# Patient Record
Sex: Female | Born: 1970 | Race: White | Hispanic: No | Marital: Married
Health system: Southern US, Community
[De-identification: ages and names within clinical notes are randomized; demographics above are authoritative.]

## PROBLEM LIST (undated history)

## (undated) DIAGNOSIS — E785 Hyperlipidemia, unspecified: Secondary | ICD-10-CM

## (undated) DIAGNOSIS — I2510 Atherosclerotic heart disease of native coronary artery without angina pectoris: Secondary | ICD-10-CM

## (undated) HISTORY — DX: Hyperlipidemia, unspecified: E78.5

## (undated) HISTORY — DX: Atherosclerotic heart disease of native coronary artery without angina pectoris: I25.10

---

## 1997-06-15 ENCOUNTER — Other Ambulatory Visit: Admission: RE | Admit: 1997-06-15 | Discharge: 1997-06-15 | Payer: Self-pay | Admitting: Obstetrics and Gynecology

## 1998-06-20 ENCOUNTER — Other Ambulatory Visit: Admission: RE | Admit: 1998-06-20 | Discharge: 1998-06-20 | Payer: Self-pay | Admitting: Obstetrics and Gynecology

## 1999-07-16 ENCOUNTER — Other Ambulatory Visit: Admission: RE | Admit: 1999-07-16 | Discharge: 1999-07-16 | Payer: Self-pay | Admitting: Obstetrics and Gynecology

## 2000-08-26 ENCOUNTER — Other Ambulatory Visit: Admission: RE | Admit: 2000-08-26 | Discharge: 2000-08-26 | Payer: Self-pay | Admitting: Obstetrics and Gynecology

## 2001-08-27 ENCOUNTER — Other Ambulatory Visit: Admission: RE | Admit: 2001-08-27 | Discharge: 2001-08-27 | Payer: Self-pay | Admitting: Obstetrics and Gynecology

## 2002-03-09 ENCOUNTER — Inpatient Hospital Stay (HOSPITAL_COMMUNITY): Admission: AD | Admit: 2002-03-09 | Discharge: 2002-03-12 | Payer: Self-pay | Admitting: Obstetrics and Gynecology

## 2002-04-13 ENCOUNTER — Other Ambulatory Visit: Admission: RE | Admit: 2002-04-13 | Discharge: 2002-04-13 | Payer: Self-pay | Admitting: Obstetrics and Gynecology

## 2003-10-11 ENCOUNTER — Other Ambulatory Visit: Admission: RE | Admit: 2003-10-11 | Discharge: 2003-10-11 | Payer: Self-pay | Admitting: Obstetrics and Gynecology

## 2004-07-26 ENCOUNTER — Inpatient Hospital Stay (HOSPITAL_COMMUNITY): Admission: AD | Admit: 2004-07-26 | Discharge: 2004-07-29 | Payer: Self-pay | Admitting: Obstetrics and Gynecology

## 2004-09-06 ENCOUNTER — Other Ambulatory Visit: Admission: RE | Admit: 2004-09-06 | Discharge: 2004-09-06 | Payer: Self-pay | Admitting: Obstetrics and Gynecology

## 2008-11-01 ENCOUNTER — Inpatient Hospital Stay (HOSPITAL_COMMUNITY): Admission: RE | Admit: 2008-11-01 | Discharge: 2008-11-02 | Payer: Self-pay | Admitting: Obstetrics and Gynecology

## 2010-04-12 LAB — CBC
HCT: 37.7 % (ref 36.0–46.0)
HCT: 38.9 % (ref 36.0–46.0)
Hemoglobin: 12.8 g/dL (ref 12.0–15.0)
MCHC: 33.8 g/dL (ref 30.0–36.0)
MCV: 96.4 fL (ref 78.0–100.0)
RBC: 4.03 MIL/uL (ref 3.87–5.11)
RDW: 13.5 % (ref 11.5–15.5)
WBC: 10.5 10*3/uL (ref 4.0–10.5)

## 2012-12-24 ENCOUNTER — Other Ambulatory Visit: Payer: Self-pay | Admitting: Obstetrics and Gynecology

## 2012-12-24 DIAGNOSIS — R928 Other abnormal and inconclusive findings on diagnostic imaging of breast: Secondary | ICD-10-CM

## 2013-01-06 ENCOUNTER — Other Ambulatory Visit: Payer: Self-pay

## 2013-01-11 ENCOUNTER — Ambulatory Visit
Admission: RE | Admit: 2013-01-11 | Discharge: 2013-01-11 | Disposition: A | Discharge status: Home / Self Care | Payer: Commercial Managed Care - PPO | Source: Ambulatory Visit | Attending: Obstetrics and Gynecology | Admitting: Obstetrics and Gynecology

## 2013-01-11 DIAGNOSIS — R928 Other abnormal and inconclusive findings on diagnostic imaging of breast: Secondary | ICD-10-CM

## 2013-01-29 ENCOUNTER — Other Ambulatory Visit: Payer: Self-pay | Admitting: Obstetrics and Gynecology

## 2013-01-29 DIAGNOSIS — R922 Inconclusive mammogram: Secondary | ICD-10-CM

## 2013-01-29 DIAGNOSIS — Z803 Family history of malignant neoplasm of breast: Secondary | ICD-10-CM

## 2013-02-05 ENCOUNTER — Telehealth: Payer: Self-pay | Admitting: Genetic Counselor

## 2013-02-05 NOTE — Telephone Encounter (Signed)
LEFT MESSAGE FOR PT TO RETURN CALL TO SCHEDULE GENETIC APPT.

## 2013-06-22 ENCOUNTER — Other Ambulatory Visit: Payer: Self-pay | Admitting: Obstetrics and Gynecology

## 2013-06-22 DIAGNOSIS — N6489 Other specified disorders of breast: Secondary | ICD-10-CM

## 2013-07-15 ENCOUNTER — Encounter (INDEPENDENT_AMBULATORY_CARE_PROVIDER_SITE_OTHER): Payer: Self-pay

## 2013-07-15 ENCOUNTER — Ambulatory Visit
Admission: RE | Admit: 2013-07-15 | Discharge: 2013-07-15 | Disposition: A | Discharge status: Home / Self Care | Payer: Commercial Managed Care - PPO | Source: Ambulatory Visit | Attending: Obstetrics and Gynecology | Admitting: Obstetrics and Gynecology

## 2013-07-15 DIAGNOSIS — N6489 Other specified disorders of breast: Secondary | ICD-10-CM

## 2014-11-14 IMAGING — US US BREAST COMPLETE UNI LEFT INC AXILLA
1 series · 7 of 7 positions shown · non-contrast
Comparison: Prior examinations

CLINICAL DATA: Patient recalled from screening for left breast
asymmetry.

EXAM:
DIGITAL DIAGNOSTIC  LEFT MAMMOGRAM WITH CAD
ULTRASOUND LEFT BREAST

[Series 1: us breast complete uni left inc axilla · 7 of 7 slices shown]
[im 1/7]
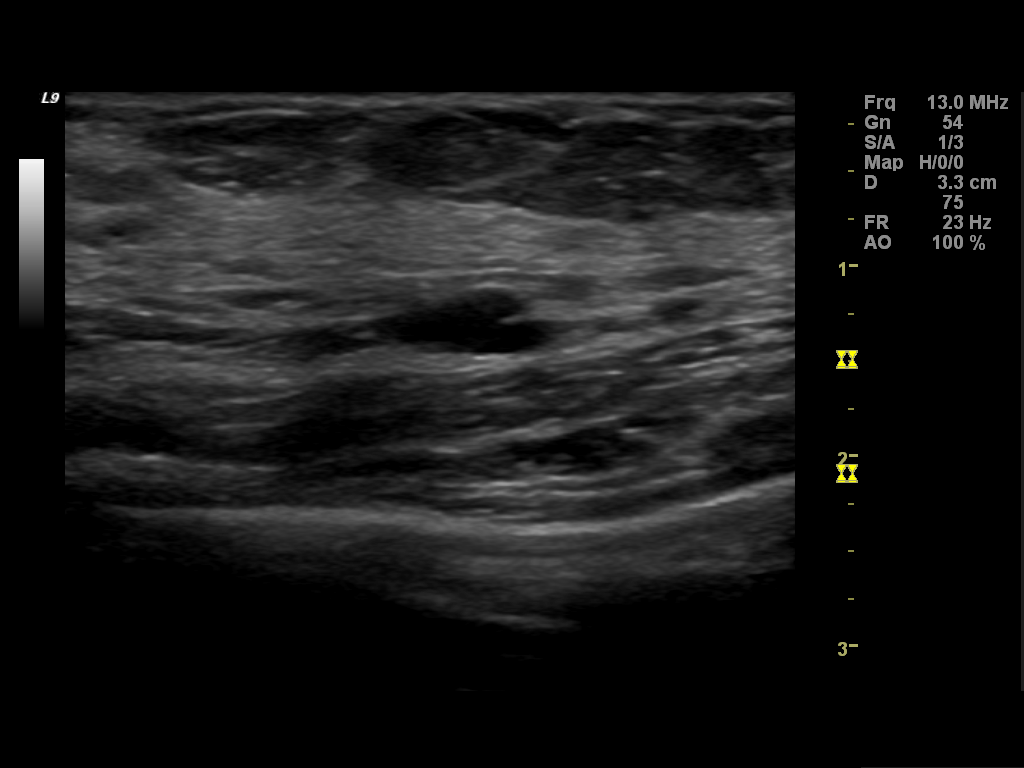
[im 2/7]
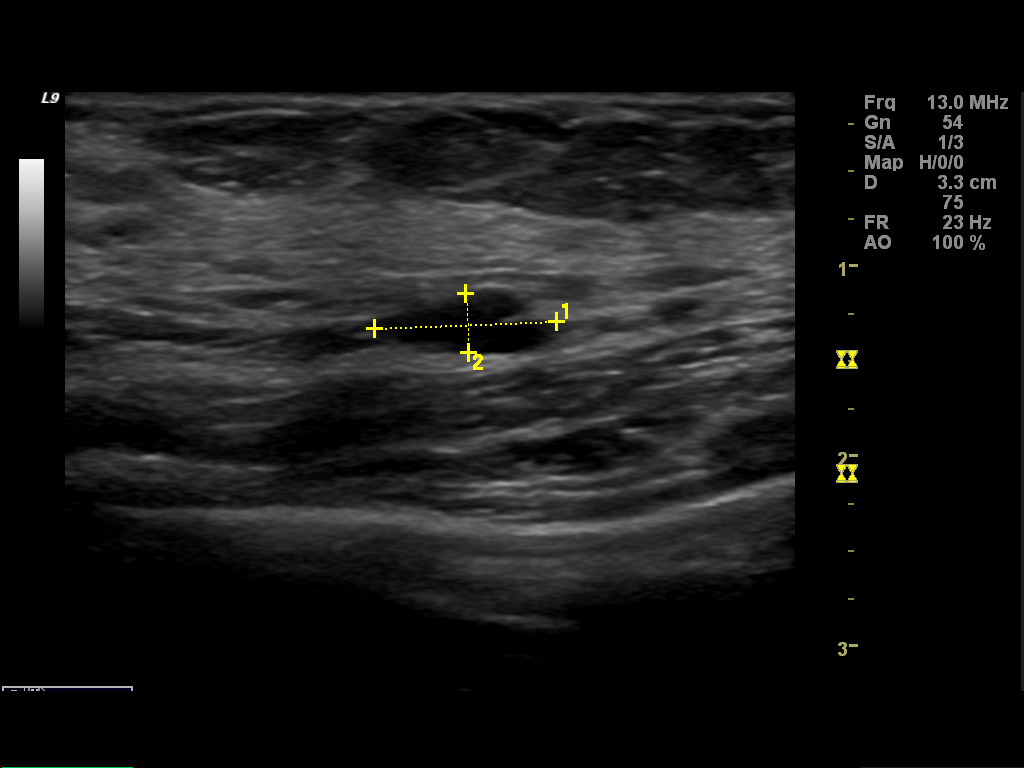
[im 3/7]
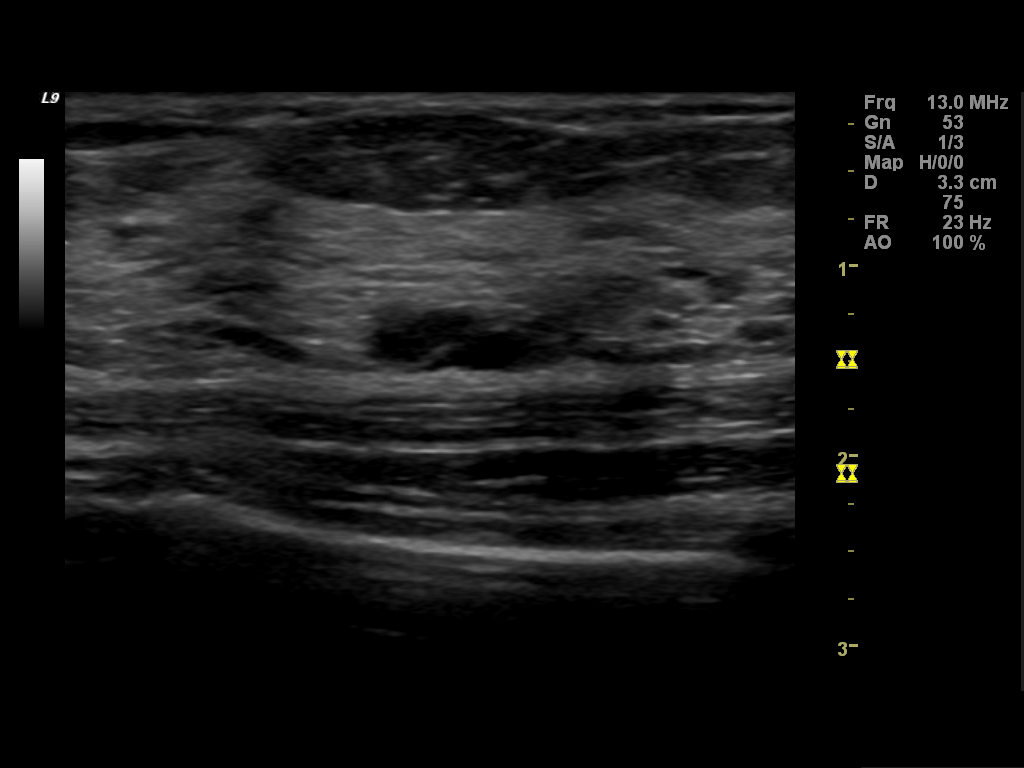
[im 4/7]
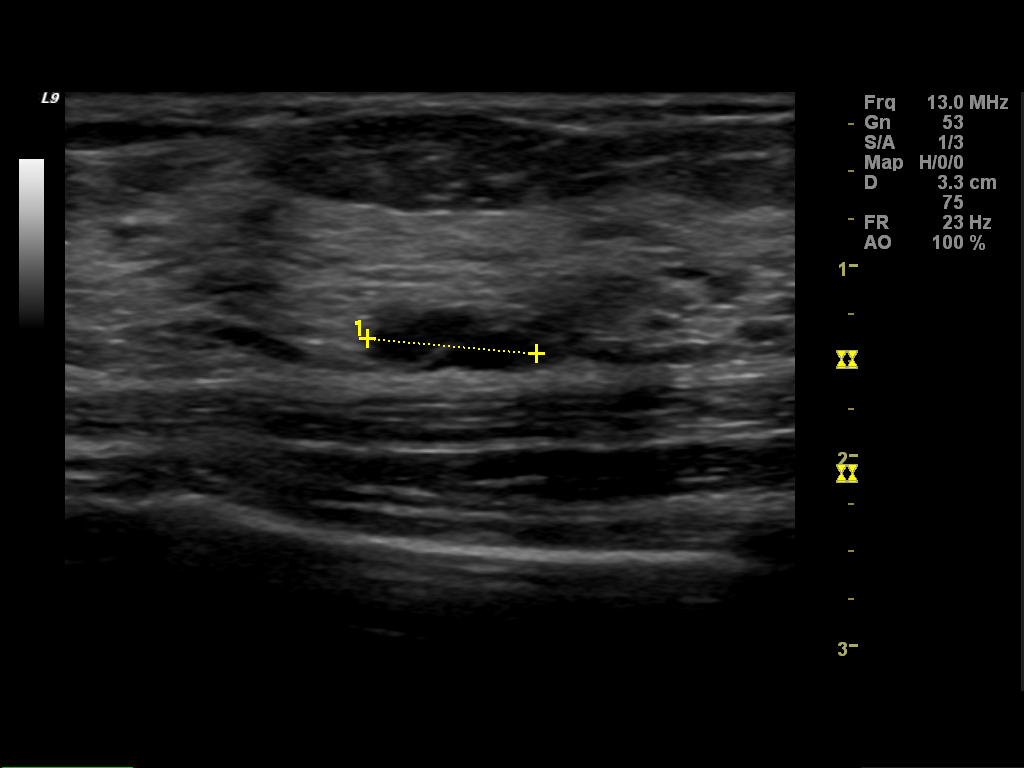
[im 5/7]
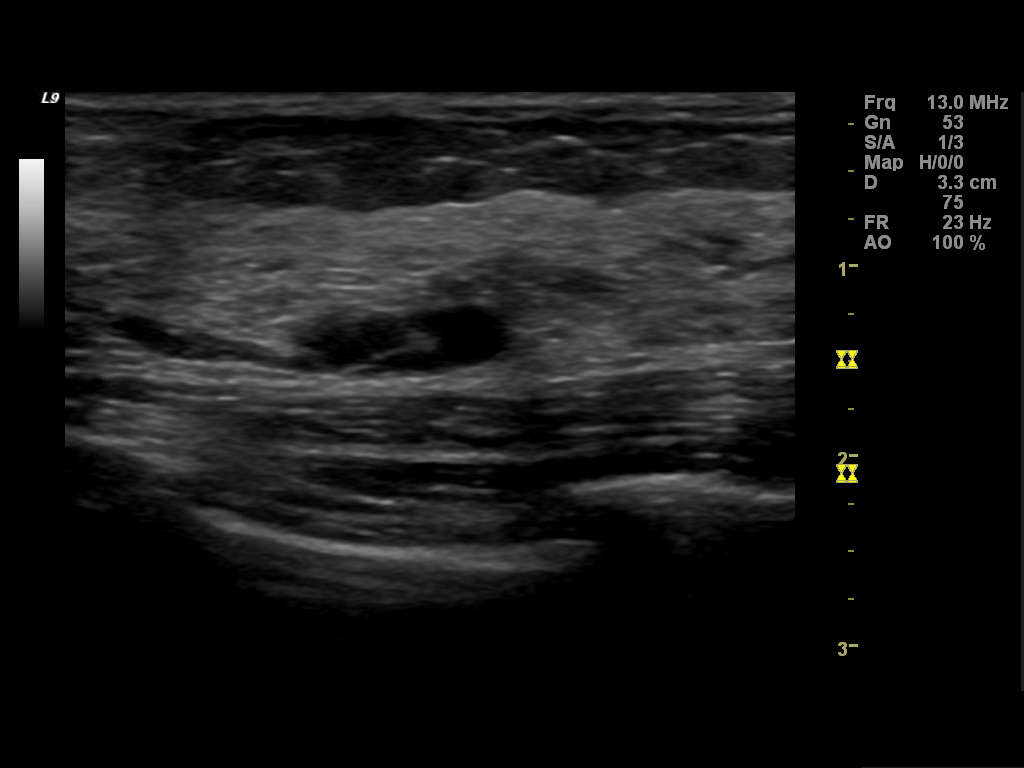
[im 6/7]
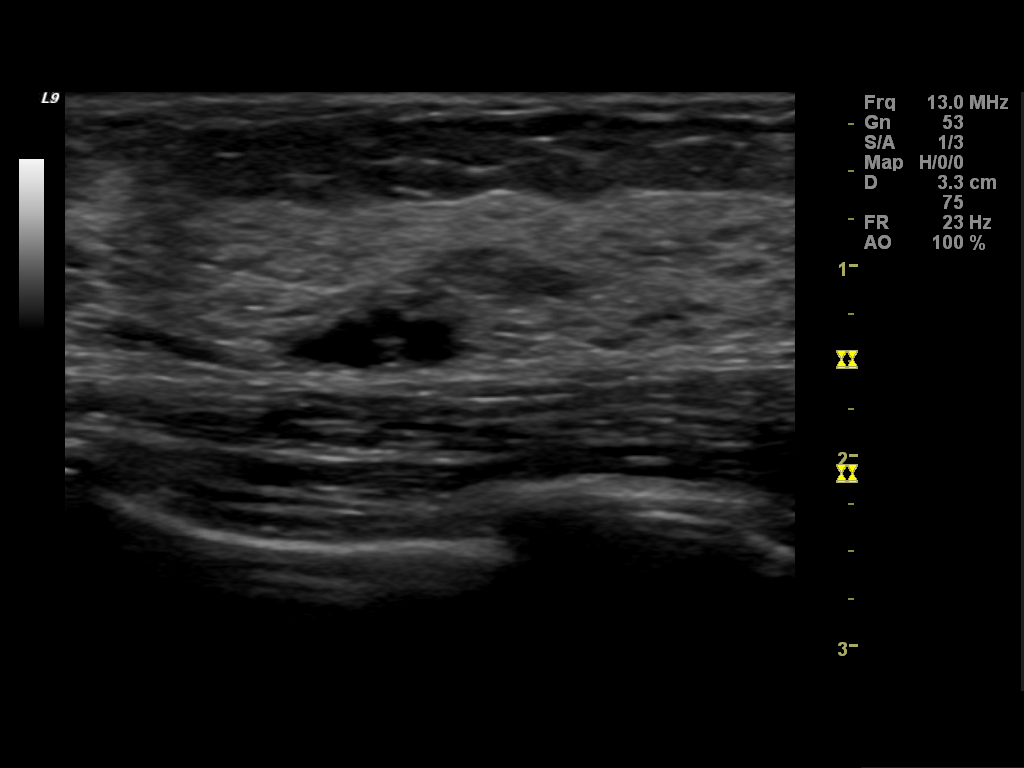
[im 7/7]
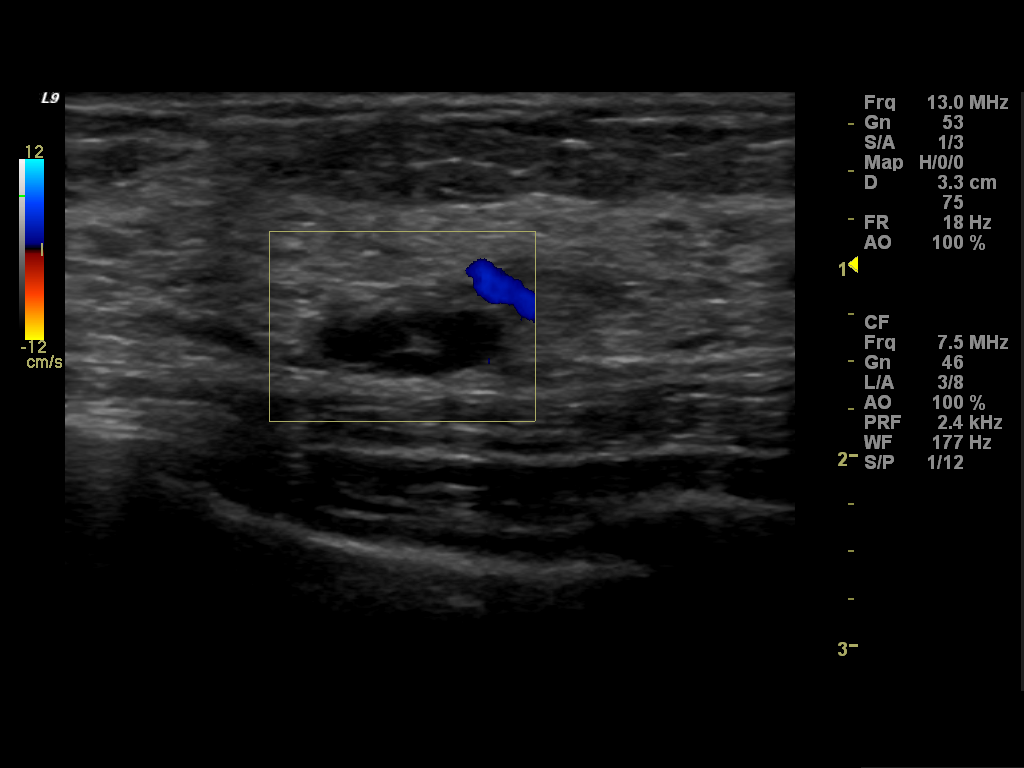

[7 of 7 positions shown; findings below may reference images not displayed]

ACR Breast Density Category c: The breasts are heterogeneously
dense, which may obscure small masses.
FINDINGS: Spot compression left breast MLO view, repeat MLO view and true
lateral view of the left breast were obtained. Questioned asymmetry
within the left breast superiorly resolved with additional imaging
compatible with overlapping fibroglandular breast tissue. As there
is residual density, an ultrasound will be performed.

Mammographic images were processed with CAD.

On physical exam, I palpate no mass within the upper-outer left
breast..

Ultrasound is performed, showing a 1.0 x 0.3 x 0.9 cm probable
cluster of microcysts within the left breast 2 o'clock position 4 cm
from the nipple..
IMPRESSION: 1. No mammographic evidence for malignancy.
2. Probable cluster of microcysts within the upper-outer left
breast.

RECOMMENDATION:
Left breast ultrasound in 6 months

I have discussed the findings and recommendations with the patient.
Results were also provided in writing at the conclusion of the
visit.

BI-RADS CATEGORY  3: Probably benign finding(s) - short interval
follow-up suggested.

## 2014-11-14 IMAGING — MG MM DIAGNOSTIC UNILATERAL L
2 series · 2 of 2 positions shown · non-contrast
Comparison: Prior examinations

CLINICAL DATA: Patient recalled from screening for left breast
asymmetry.

EXAM:
DIGITAL DIAGNOSTIC  LEFT MAMMOGRAM WITH CAD
ULTRASOUND LEFT BREAST

[L MLO (1 of 2)]
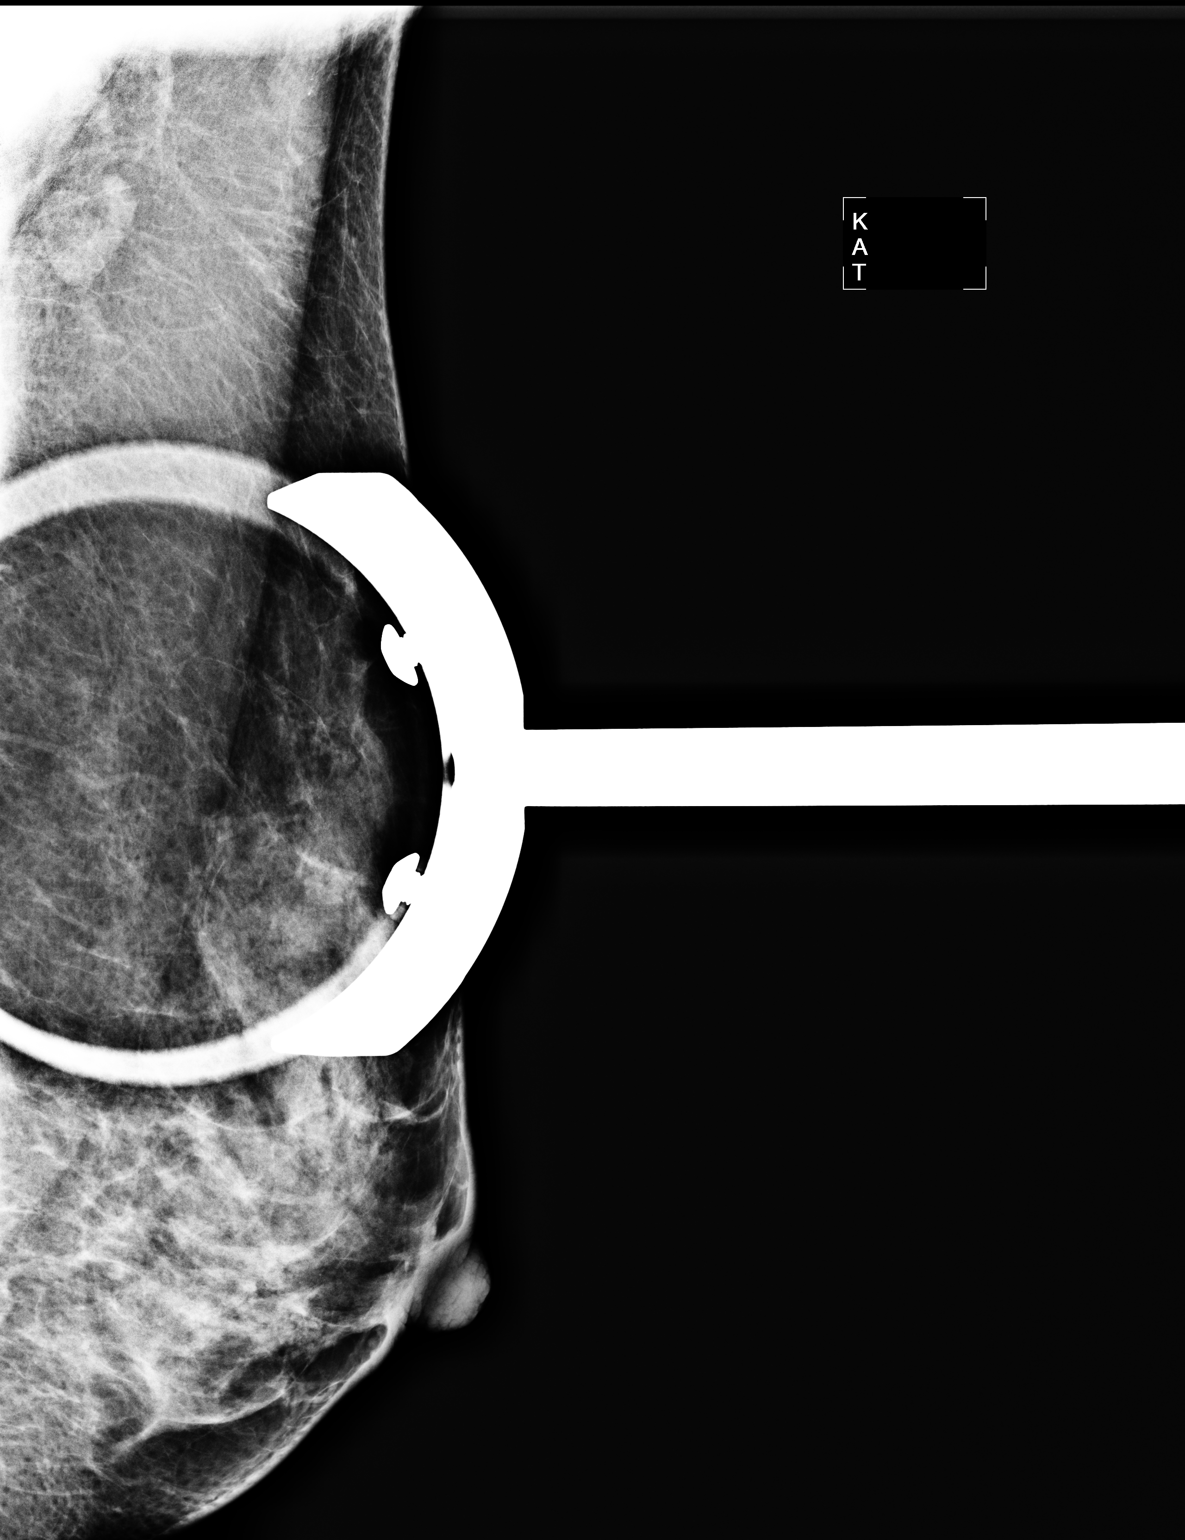

[L MLO (2 of 2)]
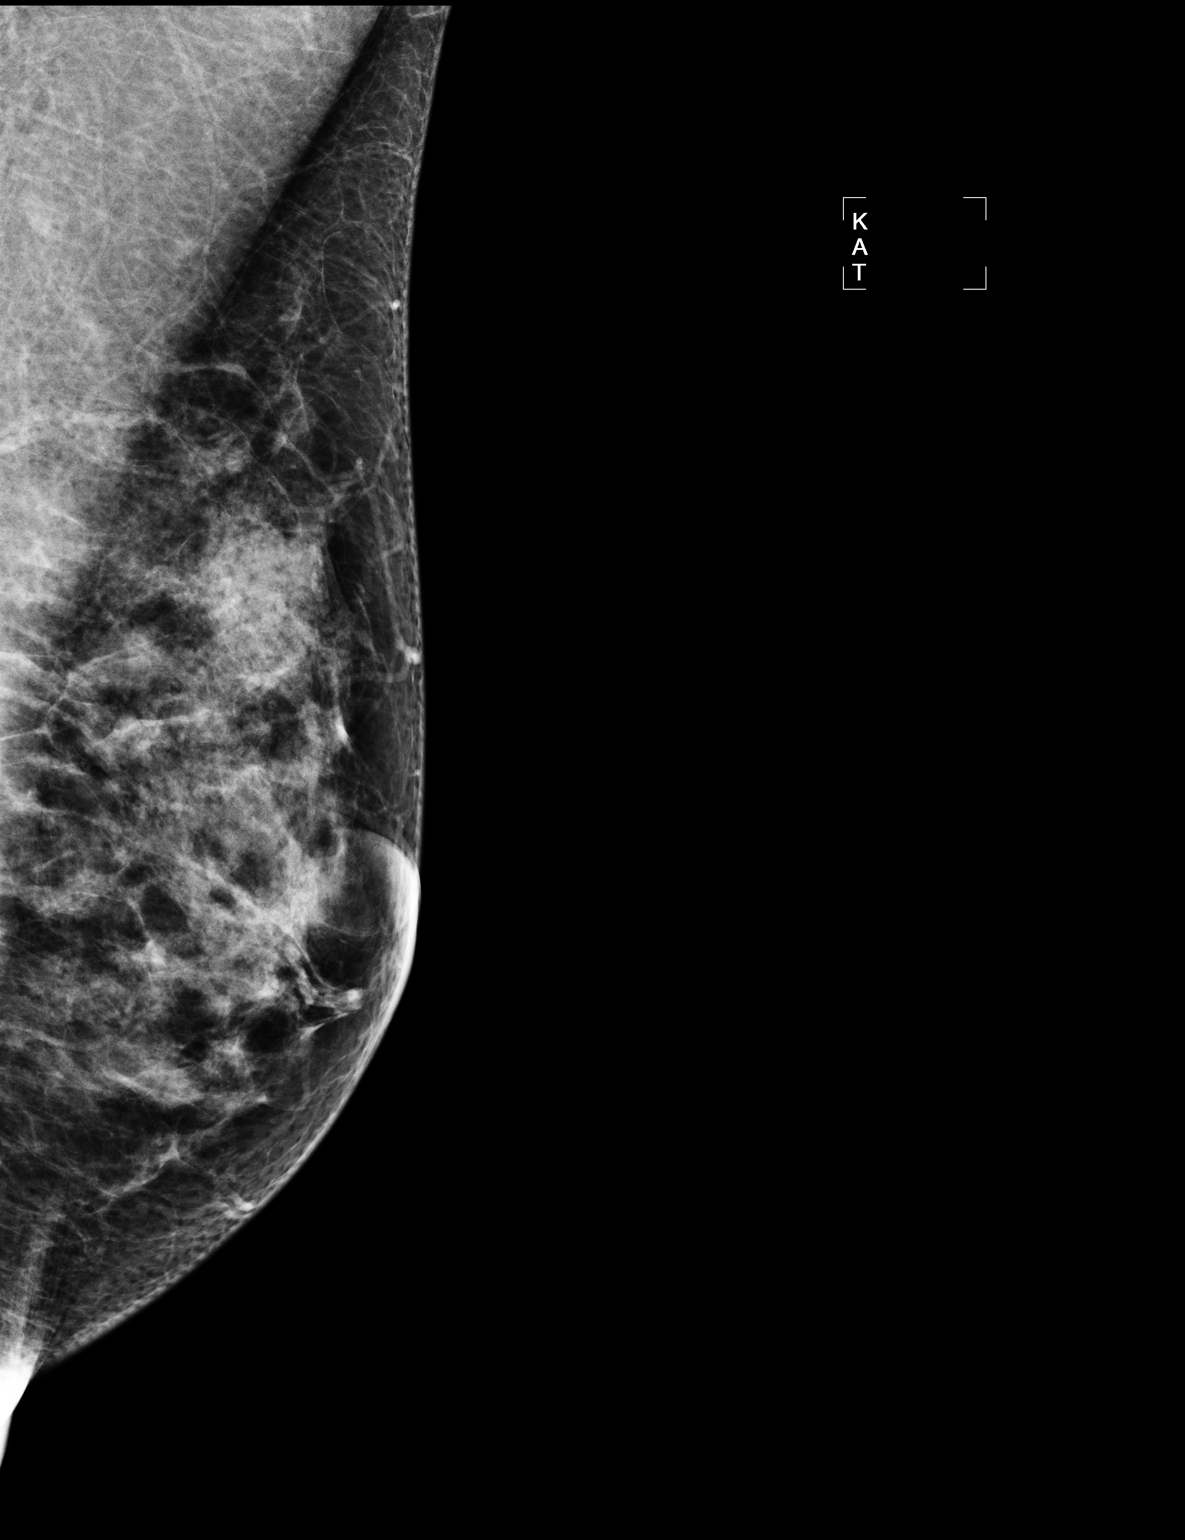

[2 of 2 positions shown; findings below may reference images not displayed]

ACR Breast Density Category c: The breasts are heterogeneously
dense, which may obscure small masses.
FINDINGS: Spot compression left breast MLO view, repeat MLO view and true
lateral view of the left breast were obtained. Questioned asymmetry
within the left breast superiorly resolved with additional imaging
compatible with overlapping fibroglandular breast tissue. As there
is residual density, an ultrasound will be performed.

Mammographic images were processed with CAD.

On physical exam, I palpate no mass within the upper-outer left
breast..

Ultrasound is performed, showing a 1.0 x 0.3 x 0.9 cm probable
cluster of microcysts within the left breast 2 o'clock position 4 cm
from the nipple..
IMPRESSION: 1. No mammographic evidence for malignancy.
2. Probable cluster of microcysts within the upper-outer left
breast.

RECOMMENDATION:
Left breast ultrasound in 6 months

I have discussed the findings and recommendations with the patient.
Results were also provided in writing at the conclusion of the
visit.

BI-RADS CATEGORY  3: Probably benign finding(s) - short interval
follow-up suggested.

## 2015-05-18 IMAGING — US US BREAST*L* LIMITED INC AXILLA
1 series · 10 of 10 positions shown · non-contrast
Comparison: Previous exams.

CLINICAL DATA: 42-year-old female with a probably benign mass in
the upper-outer left breast.

EXAM:
ULTRASOUND OF THE LEFT BREAST

[Series 1: breast · 10 of 10 slices shown]
[im 1/10]
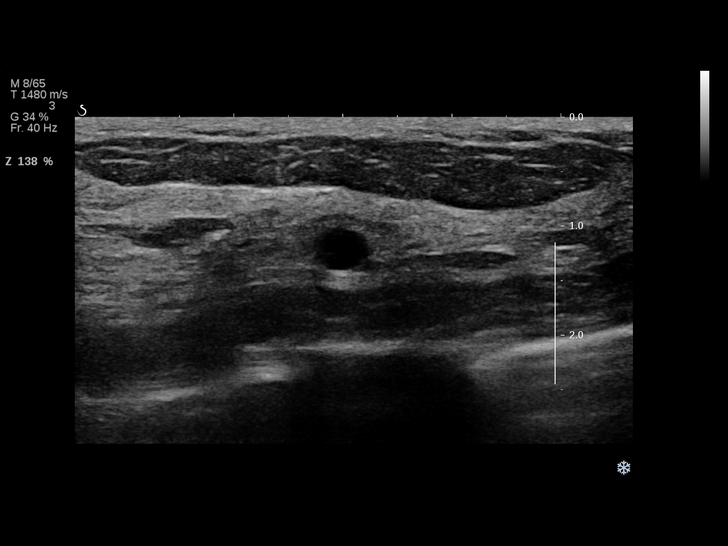
[im 2/10]
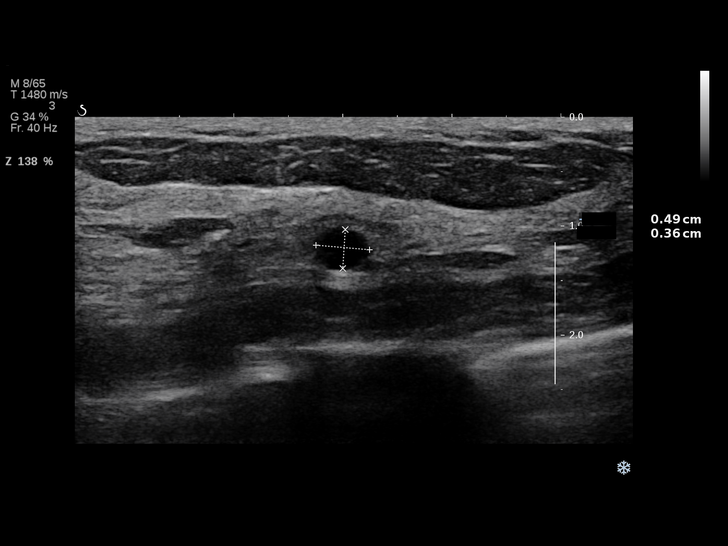
[im 3/10]
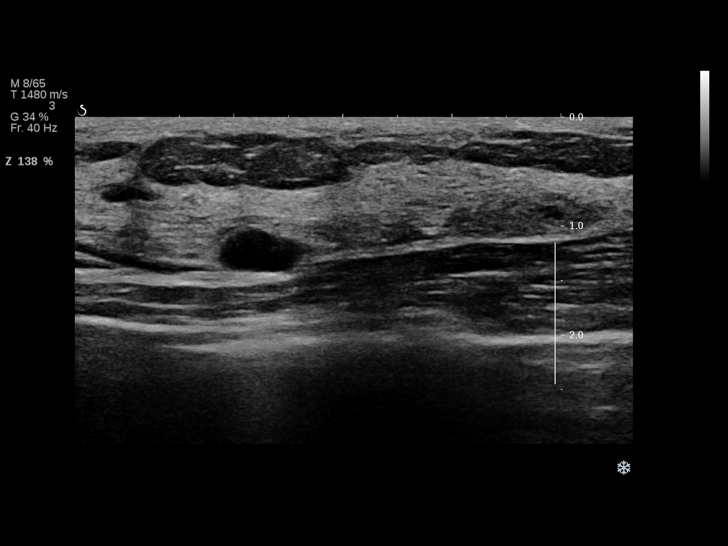
[im 4/10]
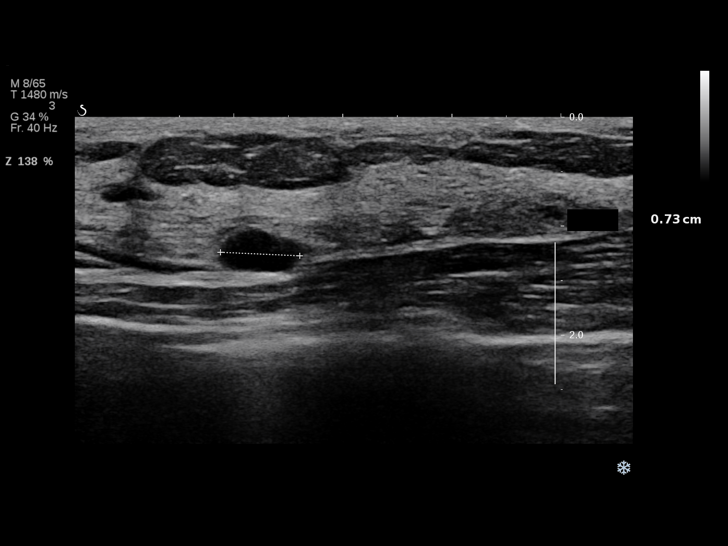
[im 5/10]
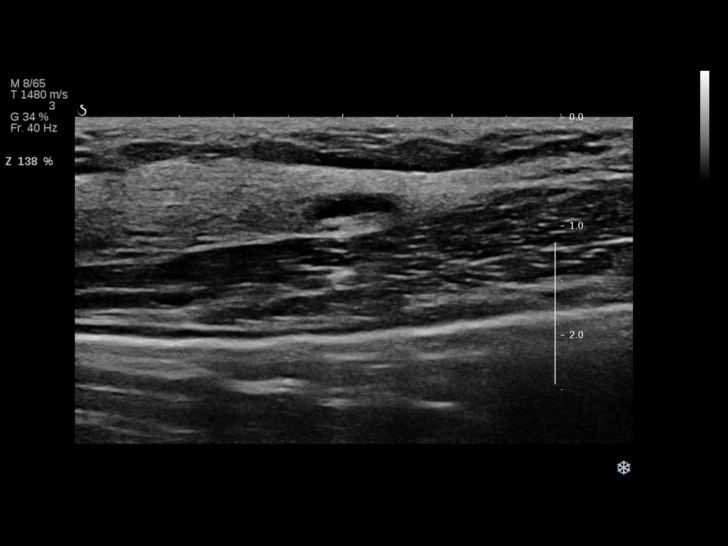
[im 6/10]
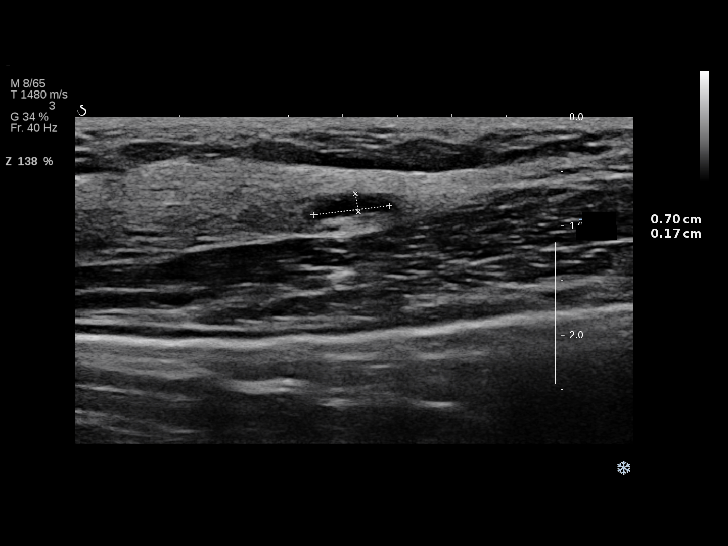
[im 7/10]
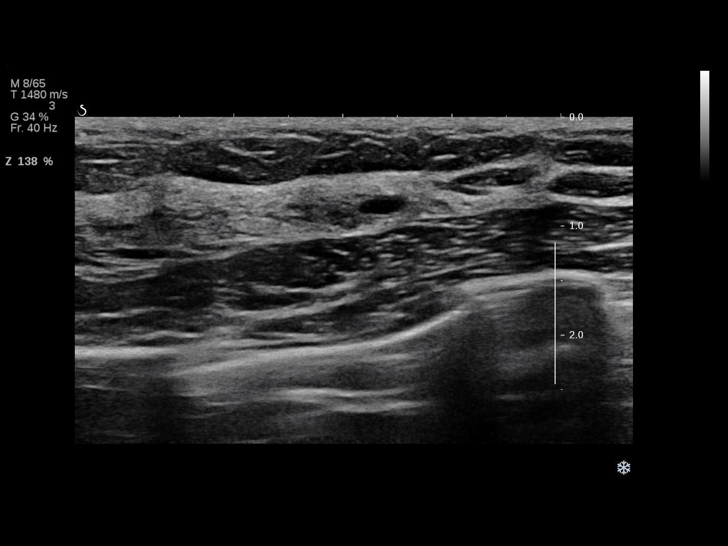
[im 8/10]
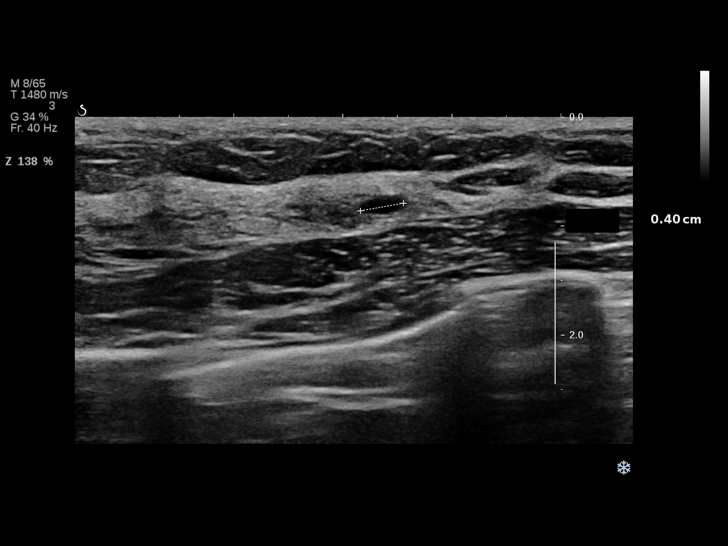
[im 9/10]
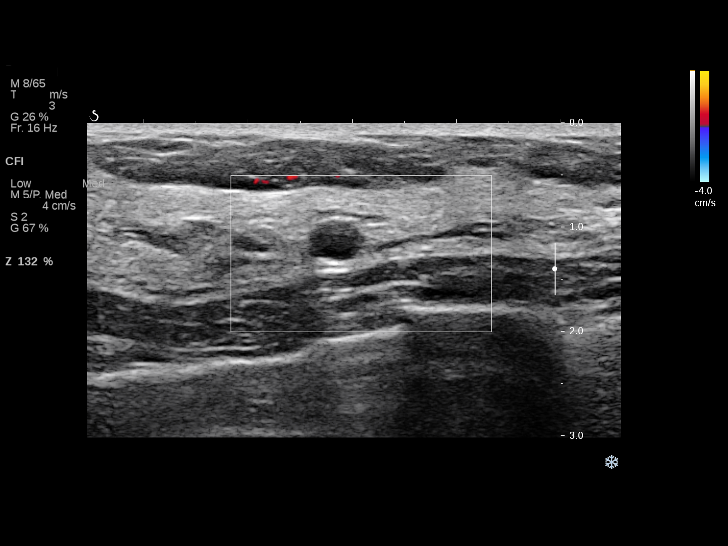
[im 10/10]
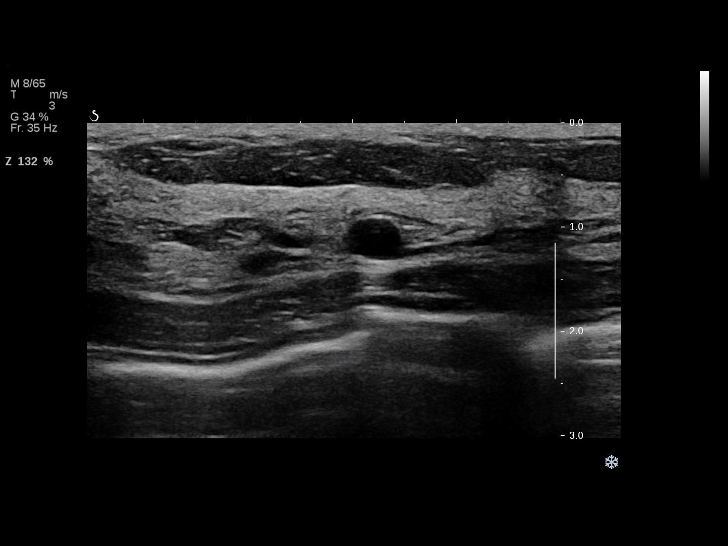

[10 of 10 positions shown; findings below may reference images not displayed]

FINDINGS: Physical examination of the upper-outer left breast does not reveal
any palpable masses.

Targeted ultrasound of the upper-outer left breast was performed
demonstrating several simple cysts at 1-2 o'clock 4 cm from nipple,
with a cyst at 2 o'clock 4 cm from the nipple measuring 0.5 x 0.4 x
0.7 cm and an additional cyst at 1 o'clock 4 cm from the nipple
measuring 0.7 x 0.2 x 0.4 cm. The previously seen probably benign
mass likely representing a cluster of cysts is not definitely seen
on today's exam.
IMPRESSION: Benign left breast cysts, with the previously seen probably benign
mass likely representing a cluster cysts not definitely seen on
today's exam.

RECOMMENDATION:
1.  Screening mammogram in one year.(Code:KW-L-42J)

2. The patient does report a strong family history of breast cancer
with her mother diagnosed with breast cancer at age 42. If the
patient's lifetime calculated risk of breast cancer is greater than
20 a 25%, she may qualify for screening breast MRI.

I have discussed the findings and recommendations with the patient.
Results were also provided in writing at the conclusion of the
visit. If applicable, a reminder letter will be sent to the patient
regarding the next appointment.

BI-RADS CATEGORY  2: Benign.

## 2021-04-20 DIAGNOSIS — C44319 Basal cell carcinoma of skin of other parts of face: Secondary | ICD-10-CM | POA: Diagnosis not present

## 2021-04-20 DIAGNOSIS — L814 Other melanin hyperpigmentation: Secondary | ICD-10-CM | POA: Diagnosis not present

## 2021-04-20 DIAGNOSIS — L0889 Other specified local infections of the skin and subcutaneous tissue: Secondary | ICD-10-CM | POA: Diagnosis not present

## 2021-04-20 DIAGNOSIS — D485 Neoplasm of uncertain behavior of skin: Secondary | ICD-10-CM | POA: Diagnosis not present

## 2021-04-20 DIAGNOSIS — B078 Other viral warts: Secondary | ICD-10-CM | POA: Diagnosis not present

## 2021-04-20 DIAGNOSIS — L570 Actinic keratosis: Secondary | ICD-10-CM | POA: Diagnosis not present

## 2021-04-20 DIAGNOSIS — L821 Other seborrheic keratosis: Secondary | ICD-10-CM | POA: Diagnosis not present

## 2021-09-24 DIAGNOSIS — C44319 Basal cell carcinoma of skin of other parts of face: Secondary | ICD-10-CM | POA: Diagnosis not present

## 2022-01-09 DIAGNOSIS — L814 Other melanin hyperpigmentation: Secondary | ICD-10-CM | POA: Diagnosis not present

## 2022-01-09 DIAGNOSIS — Z08 Encounter for follow-up examination after completed treatment for malignant neoplasm: Secondary | ICD-10-CM | POA: Diagnosis not present

## 2022-01-09 DIAGNOSIS — L821 Other seborrheic keratosis: Secondary | ICD-10-CM | POA: Diagnosis not present

## 2022-01-09 DIAGNOSIS — D225 Melanocytic nevi of trunk: Secondary | ICD-10-CM | POA: Diagnosis not present

## 2022-01-18 DIAGNOSIS — E7800 Pure hypercholesterolemia, unspecified: Secondary | ICD-10-CM | POA: Diagnosis not present

## 2022-01-18 DIAGNOSIS — Z6825 Body mass index (BMI) 25.0-25.9, adult: Secondary | ICD-10-CM | POA: Diagnosis not present

## 2022-01-18 DIAGNOSIS — Z01419 Encounter for gynecological examination (general) (routine) without abnormal findings: Secondary | ICD-10-CM | POA: Diagnosis not present

## 2022-01-18 DIAGNOSIS — Z1231 Encounter for screening mammogram for malignant neoplasm of breast: Secondary | ICD-10-CM | POA: Diagnosis not present

## 2022-01-28 ENCOUNTER — Other Ambulatory Visit: Payer: Self-pay | Admitting: Obstetrics and Gynecology

## 2022-01-28 DIAGNOSIS — Z8249 Family history of ischemic heart disease and other diseases of the circulatory system: Secondary | ICD-10-CM

## 2022-02-19 ENCOUNTER — Other Ambulatory Visit: Payer: Self-pay | Admitting: Obstetrics and Gynecology

## 2022-02-19 DIAGNOSIS — Z8249 Family history of ischemic heart disease and other diseases of the circulatory system: Secondary | ICD-10-CM

## 2022-04-15 ENCOUNTER — Encounter: Payer: Self-pay | Admitting: Radiology

## 2022-04-15 ENCOUNTER — Ambulatory Visit
Admission: RE | Admit: 2022-04-15 | Discharge: 2022-04-15 | Disposition: A | Discharge status: Home / Self Care | Payer: No Typology Code available for payment source | Source: Ambulatory Visit | Attending: Obstetrics and Gynecology | Admitting: Obstetrics and Gynecology

## 2022-04-15 DIAGNOSIS — Z8249 Family history of ischemic heart disease and other diseases of the circulatory system: Secondary | ICD-10-CM

## 2022-05-16 ENCOUNTER — Encounter: Payer: Self-pay | Admitting: Internal Medicine

## 2022-07-16 DIAGNOSIS — L814 Other melanin hyperpigmentation: Secondary | ICD-10-CM | POA: Diagnosis not present

## 2022-07-16 DIAGNOSIS — L538 Other specified erythematous conditions: Secondary | ICD-10-CM | POA: Diagnosis not present

## 2022-11-12 ENCOUNTER — Encounter (HOSPITAL_BASED_OUTPATIENT_CLINIC_OR_DEPARTMENT_OTHER): Payer: Self-pay | Admitting: Internal Medicine

## 2022-11-12 ENCOUNTER — Ambulatory Visit (HOSPITAL_BASED_OUTPATIENT_CLINIC_OR_DEPARTMENT_OTHER): Payer: BC Managed Care – PPO | Admitting: Internal Medicine

## 2022-11-12 VITALS — BP 120/80 | HR 74 | Ht 66.0 in | Wt 137.0 lb

## 2022-11-12 DIAGNOSIS — R931 Abnormal findings on diagnostic imaging of heart and coronary circulation: Secondary | ICD-10-CM

## 2022-11-12 DIAGNOSIS — E785 Hyperlipidemia, unspecified: Secondary | ICD-10-CM | POA: Diagnosis not present

## 2022-11-12 MED ORDER — ROSUVASTATIN CALCIUM 5 MG PO TABS: 5.0000 mg | ORAL_TABLET | Freq: Every day | ORAL | 3 refills | Status: AC

## 2022-11-12 NOTE — Progress Notes (Signed)
LIPID CLINIC CONSULT NOTE  Chief Complaint:  Manage dyslipidemia  Primary Care Physician: Candice Camp, MD  Primary Cardiologist:  None  HPI:  Catherine Sherman is a 52 y.o. female who is being seen today for the evaluation of dyslipidemia at the request of Candice Camp, MD. this a pleasant 52 year old female kindly referred for evaluation management of dyslipidemia.  She has an extensive family history of heart disease including her paternal grandfather and her father both of which had significant cardiovascular disease.  Her father recently had multivessel coronary disease and acute MI requiring ECMO but ultimately fortunately he survived.  Based on this it was recommended that she get calcium scoring to further risk stratify her.  She did have a calcium score in April of this year which showed a calcium score of 96.2, all of which was in the proximal LAD.  This was 98th percentile compared to age and sex matched controls.  We did discuss the importance of this showing very early onset or age advanced cardiovascular disease.  Other than this, a small hiatal hernia was noted.  She had discussed this in the context of lipids with her primary care physician/GYN and he referred her to our group.  Her lipids recently showed total cholesterol 221, HDL 59, triglycerides 88 and LDL 123.  She has not had particle testing or an LP(a).  Otherwise she is on no medications and has no other significant risk factors.  She leads a very healthy lifestyle, exercises regularly and is of normal weight.  She is completely asymptomatic with this.  She is statin and medication hesitant.  She says she has had side effects with a number of medications in the past and would like to try natural or dietary approaches with lifestyle but may be amenable to pharmacotherapy at some point.  PMHx:  Past Medical History:  Diagnosis Date   Coronary artery calcification    Hyperlipidemia     History reviewed. No pertinent  surgical history.  FAMHx:  Family History  Problem Relation Age of Onset   Heart attack Father    Heart attack Maternal Grandfather     SOCHx:   has no history on file for tobacco use, alcohol use, and drug use.  ALLERGIES:  Not on File  ROS: Pertinent items noted in HPI and remainder of comprehensive ROS otherwise negative.  HOME MEDS: No current outpatient medications on file prior to visit.   No current facility-administered medications on file prior to visit.    LABS/IMAGING: No results found for this or any previous visit (from the past 48 hour(s)). No results found.  LIPID PANEL: No results found for: "CHOL", "TRIG", "HDL", "CHOLHDL", "VLDL", "LDLCALC", "LDLDIRECT"  WEIGHTS: Wt Readings from Last 3 Encounters:  11/12/22 137 lb (62.1 kg)    VITALS: BP 120/80 (BP Location: Left Arm, Patient Position: Sitting, Cuff Size: Normal)   Pulse 74   Ht 5\' 6"  (1.676 m)   Wt 137 lb (62.1 kg)   SpO2 98%   BMI 22.11 kg/m   EXAM: Deferred  EKG: Deferred  ASSESSMENT: Dyslipidemia, goal LDL less than 70 Strong family history on her father side of cardiovascular disease Elevated CAC score of 96.2, 98th percentile all in the proximal LAD (04/2022)  PLAN: 1.   [name] has an elevated calcium score which was significantly age advanced compared to her peers.  This is not surprising given the strong family history of cardiovascular disease on her father side.  Her lipids  are actually not significantly elevated however remain above targets which would be an LDL goal less than 70 given her cardiovascular disease for secondary prevention.  Guidelines strongly suggest statin therapy which will provide both lipid-lowering, cardiovascular risk reduction, plaque stabilization and antioxidant effects to reduce the risk of sudden death as well.  There is very little secondary prevention data with diet and lifestyle measures however they could contribute somewhat to cardiovascular  risk reduction.  We discussed a number of alternative therapies at her request.  I offered a low-dose statin medication to see if this could be tolerated.  She said she would consider taking it.  Namely rosuvastatin 5 mg daily.  In addition I would advise an LP(a) lab test and lipid NMR.  This could be drawn on therapy in about 3 to 4 months after she starts that or after a few months of lifestyle modifications.  We also discussed the possibility of genetic testing today.  She understands this may not be covered by insurance and that it could be a $299 charge.  She did want to reach out for prior authorization with her insurance company and consider possibly having the test performed.  Thanks again for the kind referral.  She can follow-up with me as needed based on her lab findings and whether she elects to start prescription therapy.  Chrystie Nose, MD, [facility], FACP  [facility]  Medical Director of the [facility]  [facility] Diplomate of the American Board of Clinical Lipidology Attending Cardiologist  Direct Dial: [phone]  Fax: [phone]  Website:  [name] [name] 11/12/2022, 9:58 AM

## 2022-11-12 NOTE — Patient Instructions (Signed)
Medication Instructions:  Dr. Rennis Golden has prescribed rosuvastatin 5mg  daily Please contact our office if/when you decide to start this medication   *If you need a refill on your cardiac medications before your next appointment, please call your pharmacy*   Lab Work: If you start rosuvastatin, please plan to complete fasting lab work after 4 months on treatment.   If you have labs (blood work) drawn today and your tests are completely normal, you will receive your results only by: MyChart Message (if you have MyChart) OR A paper copy in the mail If you have any lab test that is abnormal or we need to change your treatment, we will call you to review the results.   Testing/Procedures: Genetic Testing thru [facility] CPT codes for the Dyslipidemia and ASCVD panel are as follows: 81401, 81405, 81406.    Follow-Up: At [facility], you and your health needs are our priority.  As part of our continuing mission to provide you with exceptional heart care, we have created designated Provider Care Teams.  These Care Teams include your primary Cardiologist (physician) and Advanced Practice Providers (APPs -  Physician Assistants and Nurse Practitioners) who all work together to provide you with the care you need, when you need it.  We recommend signing up for the patient portal called "MyChart".  Sign up information is provided on this After Visit Summary.  MyChart is used to connect with patients for Virtual Visits (Telemedicine).  Patients are able to view lab/test results, encounter notes, upcoming appointments, etc.  Non-urgent messages can be sent to your provider as well.   To learn more about what you can do with MyChart, go to [email or web address].    Your next appointment:   AS NEEDED pending if/when you decide to start crestor and do follow up lab work

## 2023-01-13 DIAGNOSIS — L814 Other melanin hyperpigmentation: Secondary | ICD-10-CM | POA: Diagnosis not present

## 2023-01-13 DIAGNOSIS — L821 Other seborrheic keratosis: Secondary | ICD-10-CM | POA: Diagnosis not present

## 2023-01-13 DIAGNOSIS — Z08 Encounter for follow-up examination after completed treatment for malignant neoplasm: Secondary | ICD-10-CM | POA: Diagnosis not present

## 2023-01-13 DIAGNOSIS — D225 Melanocytic nevi of trunk: Secondary | ICD-10-CM | POA: Diagnosis not present

## 2023-01-20 DIAGNOSIS — Z1329 Encounter for screening for other suspected endocrine disorder: Secondary | ICD-10-CM | POA: Diagnosis not present

## 2023-01-20 DIAGNOSIS — E7800 Pure hypercholesterolemia, unspecified: Secondary | ICD-10-CM | POA: Diagnosis not present

## 2023-01-20 DIAGNOSIS — Z1321 Encounter for screening for nutritional disorder: Secondary | ICD-10-CM | POA: Diagnosis not present

## 2023-01-20 DIAGNOSIS — Z131 Encounter for screening for diabetes mellitus: Secondary | ICD-10-CM | POA: Diagnosis not present

## 2023-01-20 DIAGNOSIS — Z01419 Encounter for gynecological examination (general) (routine) without abnormal findings: Secondary | ICD-10-CM | POA: Diagnosis not present

## 2023-01-20 DIAGNOSIS — Z124 Encounter for screening for malignant neoplasm of cervix: Secondary | ICD-10-CM | POA: Diagnosis not present

## 2023-01-20 DIAGNOSIS — Z13228 Encounter for screening for other metabolic disorders: Secondary | ICD-10-CM | POA: Diagnosis not present

## 2023-01-20 DIAGNOSIS — Z1231 Encounter for screening mammogram for malignant neoplasm of breast: Secondary | ICD-10-CM | POA: Diagnosis not present

## 2023-01-20 DIAGNOSIS — Z6822 Body mass index (BMI) 22.0-22.9, adult: Secondary | ICD-10-CM | POA: Diagnosis not present

## 2023-07-21 DIAGNOSIS — L814 Other melanin hyperpigmentation: Secondary | ICD-10-CM | POA: Diagnosis not present

## 2023-07-21 DIAGNOSIS — L815 Leukoderma, not elsewhere classified: Secondary | ICD-10-CM | POA: Diagnosis not present

## 2023-07-21 DIAGNOSIS — D225 Melanocytic nevi of trunk: Secondary | ICD-10-CM | POA: Diagnosis not present

## 2023-07-21 DIAGNOSIS — L821 Other seborrheic keratosis: Secondary | ICD-10-CM | POA: Diagnosis not present
